# Patient Record
Sex: Male | Born: 1965 | Race: White | Hispanic: No | Marital: Married | Smoking: Former smoker
Health system: Southern US, Community
[De-identification: ages and names within clinical notes are randomized; demographics above are authoritative.]

## PROBLEM LIST (undated history)

## (undated) DIAGNOSIS — K602 Anal fissure, unspecified: Secondary | ICD-10-CM

## (undated) DIAGNOSIS — K219 Gastro-esophageal reflux disease without esophagitis: Secondary | ICD-10-CM

## (undated) DIAGNOSIS — K5790 Diverticulosis of intestine, part unspecified, without perforation or abscess without bleeding: Secondary | ICD-10-CM

## (undated) HISTORY — DX: Gastro-esophageal reflux disease without esophagitis: K21.9

## (undated) HISTORY — DX: Anal fissure, unspecified: K60.2

## (undated) HISTORY — DX: Diverticulosis of intestine, part unspecified, without perforation or abscess without bleeding: K57.90

---
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
## 2003-02-12 ENCOUNTER — Emergency Department (HOSPITAL_COMMUNITY): Admission: EM | Admit: 2003-02-12 | Discharge: 2003-02-12 | Payer: Self-pay | Admitting: Emergency Medicine

## 2003-02-12 ENCOUNTER — Encounter: Payer: Self-pay | Admitting: Emergency Medicine

## 2003-02-13 ENCOUNTER — Emergency Department (HOSPITAL_COMMUNITY): Admission: EM | Admit: 2003-02-13 | Discharge: 2003-02-13 | Payer: Self-pay | Admitting: *Deleted

## 2005-10-20 ENCOUNTER — Ambulatory Visit: Payer: Self-pay | Admitting: Internal Medicine

## 2005-10-29 ENCOUNTER — Ambulatory Visit: Payer: Self-pay | Admitting: Internal Medicine

## 2005-10-29 HISTORY — PX: PANENDOSCOPY: SHX2159

## 2011-04-10 ENCOUNTER — Encounter (INDEPENDENT_AMBULATORY_CARE_PROVIDER_SITE_OTHER): Payer: Self-pay | Admitting: General Surgery

## 2011-04-10 ENCOUNTER — Ambulatory Visit (INDEPENDENT_AMBULATORY_CARE_PROVIDER_SITE_OTHER): Payer: Private Health Insurance - Indemnity | Admitting: General Surgery

## 2011-04-10 VITALS — BP 122/76 | HR 68 | Temp 97.6°F | Resp 16 | Ht 72.0 in | Wt 191.0 lb

## 2011-04-10 DIAGNOSIS — K6289 Other specified diseases of anus and rectum: Secondary | ICD-10-CM

## 2011-04-10 DIAGNOSIS — K625 Hemorrhage of anus and rectum: Secondary | ICD-10-CM

## 2011-04-10 NOTE — Patient Instructions (Signed)
Call as needed for concerns or questions

## 2011-04-10 NOTE — Progress Notes (Signed)
Subjective:   Rectal pain and bleeding  Patient ID: Joseph Dominguez, male   DOB: August 04, 1966, 45 y.o.   MRN: [id number]  HPI The patient is a 45 year old male who presents with about 6 months of rectal symptoms. His has been gradually worsening. He had intermittent symptoms prior to this but now they have become continuous. He describes pain with bowel movements. It is described as burning pain and occasionally is fairly severe. It happens with each bowel movement. He then will have a little bit of leakage which requires going back to the bathroom to clean up. He will occasionally notice a small amount of blood with bowel movement. He has not noted any definite swelling or excess tissue. His bowel movements are generally soft and easy to pass. He has never had a colonoscopy. No definite history of colon cancer in his family.  Review of Systems  Constitutional: Negative.   Respiratory: Negative.   Cardiovascular: Negative.   Gastrointestinal: Positive for anal bleeding and rectal pain. Negative for vomiting, abdominal pain, diarrhea, constipation and abdominal distention.       Objective:   Physical Exam General: Well-appearing Caucasian male in no distress Skin: Warm and dry no rash or infection Lungs: Clear without increased work of breathing Abdomen: Soft and nontender. No palpable masses or organomegaly. Rectal: On external exam there appears to be a superficial fissure just off the midline posteriorly. No external hemorrhoids or thrombosis. On digital exam there is mild tenderness. There appears to be some hypertrophy of the internal sphincter. There is a very small posterior movable mass that feels to be on a stalk consistent with a possible small polyp.  Anoscopy: There are some mild noninflamed internal hemorrhoids. I was not able to visualize what I thought was a polyp I was feeling posteriorly    Assessment:     Anal pain and bleeding. This seems most consistent on exam with a fissure. He  may also have a small anal polyp. He has not had a colonoscopy and I recommended that we have this done. We'll start him on diltiazem cream for probable fissure. See him back in one month following the colonoscopy. We discussed sphincterotomy if the diltiazem was not effective. He may also need excision of an anal polyp.    Plan:     Diltiazem cream. Colonoscopy. Return in one month.

## 2011-05-18 ENCOUNTER — Encounter: Payer: Self-pay | Admitting: Internal Medicine

## 2011-05-18 ENCOUNTER — Ambulatory Visit (INDEPENDENT_AMBULATORY_CARE_PROVIDER_SITE_OTHER): Payer: Self-pay | Admitting: Internal Medicine

## 2011-05-18 VITALS — BP 124/72 | HR 64 | Ht 72.0 in | Wt 191.0 lb

## 2011-05-18 DIAGNOSIS — K602 Anal fissure, unspecified: Secondary | ICD-10-CM

## 2011-05-18 DIAGNOSIS — K625 Hemorrhage of anus and rectum: Secondary | ICD-10-CM

## 2011-05-18 DIAGNOSIS — R112 Nausea with vomiting, unspecified: Secondary | ICD-10-CM | POA: Insufficient documentation

## 2011-05-18 MED ORDER — PEG-KCL-NACL-NASULF-NA ASC-C 100 G PO SOLR: 1.0000 | Freq: Once | ORAL | Status: DC

## 2011-05-18 NOTE — Assessment & Plan Note (Signed)
History and response to diltiazem seem to confirm this He is 45 and has had rectal bleeding so colonoscopy reasonable. He has some concern due to sister's history of Crohn's disease. Will schedule colonoscopy. The risks and benefits as well as alternatives of endoscopic procedure(s) have been discussed and reviewed. All questions answered. The patient agrees to proceed.

## 2011-05-18 NOTE — Assessment & Plan Note (Signed)
I suggested he consider an ENT evaluation for possible inner ear problems as a cause. History does not really help much and chronicity supports a benign etiology.

## 2011-05-18 NOTE — Patient Instructions (Addendum)
You have been given a separate informational sheet regarding your tobacco use, the importance of quitting and local resources to help you quit. You have been scheduled for a Colonoscopy with separate instructions given. Your prep kit has been sent to your pharmacy for you to pick up. 

## 2011-05-18 NOTE — Progress Notes (Signed)
  Subjective:    Patient ID: Joseph Dominguez, male    DOB: [birth year], 45 y.o.   MRN: [id number]  HPI One year history of rectal pain and some rectal bleeding. Painful defecation and bright red blood on stool and toilet paper. Evaluated by Dr. Johna Sheriff and was diagnosed with an anal fissure and possible anal polyp 03/2011. Has been on diltiazem ream or gel and had a great response and minimal if any symptoms now. Dr. Johna Sheriff thought colonoscopy would be helpful to exclude other problems.   He also has 5 years of episodic and unpredictable nausea, sudden and sometimes asociated with vomiting. No associated symptoms or timing. Not related to meals or defecation. No vestibular or ear symptoms.   Review of Systems As above, no headacehs, visual changes and all other ROS negatve    Objective:   Physical Exam General: Well-developed, well-nourished and in no acute distress Vitals: Reviewed and listed above Eyes:anicteric. Mouth and posterior pharynx: normal.  Neck: supple w/o thyromegaly or mass.  Lungs: clear. Heart: S1S2, no rubs, murmurs, gallops. Abdomen: soft, non-tender, no hepatosplenomegaly, hernia, or mass and BS+.  Rectal: inspected - small posterior sentinel pile otherwise anal inspection normal Lymphatics: no cervical, [place] or inguinal nodes. Extremities:  no edema Skin no rash. Neuro: nonfocal. A&O x 3 Romberg negative Psych: appropriate mood and  affect.        Assessment & Plan:

## 2011-05-21 ENCOUNTER — Encounter: Payer: Self-pay | Admitting: Internal Medicine

## 2011-05-22 ENCOUNTER — Encounter (INDEPENDENT_AMBULATORY_CARE_PROVIDER_SITE_OTHER): Payer: Private Health Insurance - Indemnity | Admitting: General Surgery

## 2011-05-29 ENCOUNTER — Encounter (INDEPENDENT_AMBULATORY_CARE_PROVIDER_SITE_OTHER): Payer: Self-pay | Admitting: General Surgery

## 2011-06-24 ENCOUNTER — Ambulatory Visit (AMBULATORY_SURGERY_CENTER): Payer: Self-pay | Admitting: Internal Medicine

## 2011-06-24 ENCOUNTER — Encounter: Payer: Self-pay | Admitting: Internal Medicine

## 2011-06-24 VITALS — BP 114/66 | HR 54 | Temp 97.3°F | Resp 16 | Ht 72.0 in | Wt 191.0 lb

## 2011-06-24 DIAGNOSIS — K648 Other hemorrhoids: Secondary | ICD-10-CM

## 2011-06-24 DIAGNOSIS — R112 Nausea with vomiting, unspecified: Secondary | ICD-10-CM

## 2011-06-24 DIAGNOSIS — K625 Hemorrhage of anus and rectum: Secondary | ICD-10-CM

## 2011-06-24 DIAGNOSIS — K602 Anal fissure, unspecified: Secondary | ICD-10-CM

## 2011-06-24 DIAGNOSIS — D126 Benign neoplasm of colon, unspecified: Secondary | ICD-10-CM

## 2011-06-24 HISTORY — PX: COLONOSCOPY: SHX174

## 2011-06-24 MED ORDER — SODIUM CHLORIDE 0.9 % IV SOLN: 500.0000 mL | INTRAVENOUS | Status: DC

## 2011-06-24 NOTE — Progress Notes (Signed)
No complaints in the recovery room or on discharge.  maw

## 2011-06-24 NOTE — Patient Instructions (Addendum)
There were 2 small polyps removed. They appear benign and I will send you a letter about the results. Some small hemorrhoids. I did not see any active fissure at this time. I will let you know when your next routine preventive colonoscopy should be depending upon the polyp pathology results. Iva Boop, MD, [facility]   Please call if any questions or concerns. Please follow the blue and green discharge instruction sheets the rest of the day.

## 2011-06-25 ENCOUNTER — Telehealth: Payer: Self-pay | Admitting: *Deleted

## 2011-06-25 NOTE — Telephone Encounter (Signed)
No answer, message left

## 2011-07-05 ENCOUNTER — Encounter: Payer: Self-pay | Admitting: Internal Medicine

## 2011-07-05 NOTE — Progress Notes (Signed)
Quick Note:  No significant polyps, both were diminutive and hyperplastic. Plan routine repeat colonoscopy 10 years, 2022 ______

## 2011-07-06 ENCOUNTER — Encounter: Payer: Self-pay | Admitting: *Deleted

## 2011-07-23 NOTE — Progress Notes (Signed)
Please notify the patient that we have his pathology information.  It is ok to leave him a message that we need to speak to him - we may be able to get a correct address that way, too.

## 2011-07-23 NOTE — Progress Notes (Signed)
Pathology results letter returned via [employer]- "Return to sender-no such street-unable to forward"  Verified address on envelope with address on file.  Attempted to call patient- No ID on answering machine so no message was left.

## 2011-07-24 NOTE — Progress Notes (Signed)
Spoke with patient regarding pathology results letter.  Pt states he does not receive mail at his residential address, rather a [address]. He would like a copy of his path results mailed to: [id number], [place], [place] [address].

## 2016-07-16 ENCOUNTER — Telehealth (HOSPITAL_COMMUNITY): Payer: Self-pay | Admitting: Internal Medicine

## 2016-07-17 NOTE — Telephone Encounter (Signed)
Close encounter 

## 2016-07-21 ENCOUNTER — Other Ambulatory Visit: Payer: Self-pay | Admitting: Internal Medicine

## 2016-07-21 DIAGNOSIS — R079 Chest pain, unspecified: Secondary | ICD-10-CM

## 2016-07-24 ENCOUNTER — Encounter: Payer: Self-pay | Admitting: Internal Medicine

## 2016-07-31 ENCOUNTER — Ambulatory Visit (INDEPENDENT_AMBULATORY_CARE_PROVIDER_SITE_OTHER): Payer: Managed Care, Other (non HMO)

## 2016-07-31 DIAGNOSIS — R079 Chest pain, unspecified: Secondary | ICD-10-CM | POA: Diagnosis not present

## 2016-07-31 LAB — EXERCISE TOLERANCE TEST
CHL CUP RESTING HR STRESS: 63 {beats}/min
CHL CUP STRESS STAGE 1 GRADE: 0 %
CHL CUP STRESS STAGE 1 HR: 75 {beats}/min
CHL CUP STRESS STAGE 1 SBP: 130 mmHg
CHL CUP STRESS STAGE 1 SPEED: 0 mph
CHL CUP STRESS STAGE 2 GRADE: 0 %
CHL CUP STRESS STAGE 2 HR: 63 {beats}/min
CHL CUP STRESS STAGE 2 SPEED: 1 mph
CHL CUP STRESS STAGE 4 GRADE: 10 %
CHL CUP STRESS STAGE 5 GRADE: 12 %
CHL CUP STRESS STAGE 5 SBP: 156 mmHg
CHL CUP STRESS STAGE 5 SPEED: 2.5 mph
CHL CUP STRESS STAGE 6 GRADE: 14 %
CHL CUP STRESS STAGE 6 HR: 146 {beats}/min
CHL CUP STRESS STAGE 7 GRADE: 16 %
CHL CUP STRESS STAGE 7 HR: 153 {beats}/min
CHL CUP STRESS STAGE 7 SPEED: 4.2 mph
CHL CUP STRESS STAGE 8 DBP: 69 mmHg
CHL CUP STRESS STAGE 9 DBP: 78 mmHg
CHL CUP STRESS STAGE 9 SPEED: 0 mph
CHL RATE OF PERCEIVED EXERTION: 17
CSEPED: 10 min
CSEPEDS: 0 s
CSEPEW: 11.7 METS
CSEPHR: 93 %
MPHR: 168 {beats}/min
Peak HR: 153 {beats}/min
Percent of predicted max HR: 91 %
Stage 1 DBP: 83 mmHg
Stage 3 Grade: 0 %
Stage 3 HR: 63 {beats}/min
Stage 3 Speed: 1 mph
Stage 4 DBP: 78 mmHg
Stage 4 HR: 98 {beats}/min
Stage 4 SBP: 141 mmHg
Stage 4 Speed: 1.7 mph
Stage 5 DBP: 78 mmHg
Stage 5 HR: 121 {beats}/min
Stage 6 DBP: 82 mmHg
Stage 6 SBP: 162 mmHg
Stage 6 Speed: 3.4 mph
Stage 8 Grade: 0 %
Stage 8 HR: 131 {beats}/min
Stage 8 SBP: 173 mmHg
Stage 8 Speed: 1.5 mph
Stage 9 Grade: 0 %
Stage 9 HR: 99 {beats}/min
Stage 9 SBP: 146 mmHg

## 2019-06-29 ENCOUNTER — Ambulatory Visit (INDEPENDENT_AMBULATORY_CARE_PROVIDER_SITE_OTHER): Payer: Commercial Managed Care - PPO | Admitting: Physician Assistant

## 2019-06-29 ENCOUNTER — Other Ambulatory Visit: Payer: Self-pay

## 2019-06-29 ENCOUNTER — Encounter: Payer: Self-pay | Admitting: Physician Assistant

## 2019-06-29 VITALS — BP 102/62 | HR 58 | Temp 97.6°F | Ht 73.0 in | Wt 224.0 lb

## 2019-06-29 DIAGNOSIS — Z1159 Encounter for screening for other viral diseases: Secondary | ICD-10-CM

## 2019-06-29 DIAGNOSIS — K625 Hemorrhage of anus and rectum: Secondary | ICD-10-CM | POA: Diagnosis not present

## 2019-06-29 NOTE — Progress Notes (Signed)
Chief Complaint: Rectal bleeding  HPI:    Joseph Dominguez is a 53 year old Caucasian male with a past medical history as listed below, known to Dr. Carlean Purl, who was referred to me by Ileana Roup, MD for a complaint of rectal bleeding.    05/24/2011 colonoscopy Dr. Carlean Purl with removal of 2 diminutive polyps, moderate diverticulosis in the sigmoid colon and internal hemorrhoids.  Pathology showed benign polyps and repeat was recommended in 10 years.    05/30/2019 patient seen by [facility], Dr. Dema Severin, for bright red blood per rectum after bowel movement/with bowel movements as well as occasionally some leakage of stool after bowel movement/moisture.  At that time discussed his symptoms which were similar back in 2012 which improved on their own.  Over the past 6 or 7 months patient had noticed repeat symptoms and was referred to [facility] for question of fissure.  Patient evaluated with an anoscopy which showed small hypertrophic papillae on both the right and left side and small internal hemorrhoids.  There was no evidence of any fistula or fissure.  At that time he was referred back to [place] given his rectal exam without any major findings and reports of bleeding.    Today, the patient tells me that he has bright red blood on the toilet paper as well as in his stool off and on, this typically last for 12 hours when it starts and then will stop on its own.  Often related to a larger stool than normal, sometimes with rectal pain.  Typically he places Preparation H on the area and it gets better.  Tells me that he had a long talk with Dr. Dema Severin about using fiber and not spending so much time on the toilet and drinking water which he thinks is helping over the past month or so.  Explained that there was some worry regarding his rectal bleeding and no obvious sign for it during time of Dr. Orest Dikes exam.  He recommended having a colonoscopy.    Denies fever, chills, weight loss, abdominal pain, change in bowel habits or  symptoms that awaken him from sleep.     Past Medical History:  Diagnosis Date  . Anal fissure   . Diverticulosis   . GERD (gastroesophageal reflux disease)   . Hemorrhoids 2012    Past Surgical History:  Procedure Laterality Date  . COLONOSCOPY  06/24/11   Diverticulosis, internal hemorrhoids,  no significant  polyps  . MANDIBLE SURGERY  1979   Lower jaw alignment  . PANENDOSCOPY  10/29/2005   hiatal hernia    Current Outpatient Medications  Medication Sig Dispense Refill  . psyllium (METAMUCIL) 58.6 % powder Take 1 packet by mouth daily.     No current facility-administered medications for this visit.     Allergies as of 06/29/2019 - Review Complete 06/29/2019  Allergen Reaction Noted  . Sulfa antibiotics Rash 04/10/2011    Family History  Problem Relation Age of Onset  . Diabetes Mother   . Diabetes Father   . Colon cancer Neg Hx   . Stomach cancer Neg Hx   . Pancreatic cancer Neg Hx   . Esophageal cancer Neg Hx     Social History   Socioeconomic History  . Marital status: Married    Spouse name: Not on file  . Number of children: Not on file  . Years of education: Not on file  . Highest education level: Not on file  Occupational History  . Not on file  Social  Needs  . Financial resource strain: Not on file  . Food insecurity    Worry: Not on file    Inability: Not on file  . Transportation needs    Medical: Not on file    Non-medical: Not on file  Tobacco Use  . Smoking status: Current Every Day Smoker    Packs/day: 0.25    Types: Cigarettes  . Smokeless tobacco: Never Used  . Tobacco comment: Patient given counseling sheet on smoking in exam room  Substance and Sexual Activity  . Alcohol use: Yes    Alcohol/week: 3.0 standard drinks    Types: 3 drink(s) per week  . Drug use: No  . Sexual activity: Not on file  Lifestyle  . Physical activity    Days per week: Not on file    Minutes per session: Not on file  . Stress: Not on file   Relationships  . Social Herbalist on phone: Not on file    Gets together: Not on file    Attends religious service: Not on file    Active member of club or organization: Not on file    Attends meetings of clubs or organizations: Not on file    Relationship status: Not on file  . Intimate partner violence    Fear of current or ex partner: Not on file    Emotionally abused: Not on file    Physically abused: Not on file    Forced sexual activity: Not on file  Other Topics Concern  . Not on file  Social History Narrative  . Not on file    Review of Systems:    Constitutional: No weight loss, fever or chills Skin: No rash  Cardiovascular: No chest pain Respiratory: No SOB Gastrointestinal: See HPI and otherwise negative Genitourinary: No dysuria Neurological: No headache, dizziness or syncope Musculoskeletal: No new muscle or joint pain Hematologic: No bruising Psychiatric: No history of depression or anxiety   Physical Exam:  Vital signs: BP 102/62   Pulse (!) 58   Temp 97.6 F (36.4 C)   Ht 6\' 1"  (1.854 m)   Wt 224 lb (101.6 kg)   BMI 29.55 kg/m   Constitutional:   Pleasant Caucasian male appears to be in NAD, Well developed, Well nourished, alert and cooperative Head:  Normocephalic and atraumatic. Eyes:   PEERL, EOMI. No icterus. Conjunctiva pink. Ears:  Normal auditory acuity. Neck:  Supple Throat: Oral cavity and pharynx without inflammation, swelling or lesion.  Respiratory: Respirations even and unlabored. Lungs clear to auscultation bilaterally.   No wheezes, crackles, or rhonchi.  Cardiovascular: Normal S1, S2. No MRG. Regular rate and rhythm. No peripheral edema, cyanosis or pallor.  Gastrointestinal:  Soft, nondistended, nontender. No rebound or guarding. Normal bowel sounds. No appreciable masses or hepatomegaly. Rectal:  Not performed.  Msk:  Symmetrical without gross deformities. Without edema, no deformity or joint abnormality.  Neurologic:   Alert and  oriented x4;  grossly normal neurologically.  Skin:   Dry and intact without significant lesions or rashes. Psychiatric: Demonstrates good judgement and reason without abnormal affect or behaviors.  No recent labs or imaging.  Assessment: 1.  Rectal bleeding: Intermittently over the past 8 years or so, worse over the past 6 to 7 months, recent anoscopy by Dr. Dema Severin with [facility] with no real findings other than some small internal hemorrhoids; consider hemorrhoids versus fissure versus other  Plan: 1.  Scheduled the patient for a diagnostic colonoscopy given his rectal  bleeding.  This was scheduled with Dr. Carlean Purl in the [facility].  Did discuss risks, benefits, limitations and alternatives and the patient agrees to proceed.  He will be Covid tested prior to time of exam. 2.  Recommend the patient continue his increased fiber and water intake as well as decreasing time on the toilet, everything he discussed with Dr. Dema Severin is very aimportant 3.  Patient to follow in clinic per recommendations from Dr. Carlean Purl after time of procedure.  Joseph Newer, PA-C [facility] 06/29/2019, 8:43 AM  Cc: Ileana Roup, MD

## 2019-06-29 NOTE — Patient Instructions (Signed)
If you are age 55 or older, your body mass index should be between 23-30. Your Body mass index is 29.55 kg/m. If this is out of the aforementioned range listed, please consider follow up with your Primary Care Provider.  If you are age 87 or younger, your body mass index should be between 19-25. Your Body mass index is 29.55 kg/m. If this is out of the aformentioned range listed, please consider follow up with your Primary Care Provider.   You have been scheduled for a colonoscopy. Please follow written instructions given to you at your visit today.  Please pick up your prep supplies at the pharmacy within the next 1-3 days. If you use inhalers (even only as needed), please bring them with you on the day of your procedure.

## 2019-07-07 ENCOUNTER — Encounter: Payer: Self-pay | Admitting: Internal Medicine

## 2019-07-12 ENCOUNTER — Other Ambulatory Visit: Payer: Self-pay | Admitting: Internal Medicine

## 2019-07-14 LAB — SARS CORONAVIRUS 2 (TAT 6-24 HRS): SARS Coronavirus 2: NEGATIVE

## 2019-07-17 ENCOUNTER — Encounter: Payer: Self-pay | Admitting: Internal Medicine

## 2019-07-17 ENCOUNTER — Ambulatory Visit (AMBULATORY_SURGERY_CENTER): Payer: Commercial Managed Care - PPO | Admitting: Internal Medicine

## 2019-07-17 ENCOUNTER — Other Ambulatory Visit: Payer: Self-pay

## 2019-07-17 VITALS — BP 104/68 | HR 54 | Temp 97.6°F | Resp 13 | Ht 73.0 in | Wt 224.0 lb

## 2019-07-17 DIAGNOSIS — K6289 Other specified diseases of anus and rectum: Secondary | ICD-10-CM

## 2019-07-17 DIAGNOSIS — D123 Benign neoplasm of transverse colon: Secondary | ICD-10-CM

## 2019-07-17 DIAGNOSIS — K625 Hemorrhage of anus and rectum: Secondary | ICD-10-CM

## 2019-07-17 MED ORDER — SODIUM CHLORIDE 0.9 % IV SOLN: 500.0000 mL | Freq: Once | INTRAVENOUS | Status: AC

## 2019-07-17 NOTE — Progress Notes (Signed)
A/ox3, pleased with MAC, report to RN 

## 2019-07-17 NOTE — Progress Notes (Signed)
Called to room to assist during endoscopic procedure.  Patient ID and intended procedure confirmed with present staff. Received instructions for my participation in the procedure from the performing physician.  

## 2019-07-17 NOTE — Progress Notes (Signed)
Joseph Dominguez-checked patient in.

## 2019-07-17 NOTE — Patient Instructions (Addendum)
I found and removed one tiny polyp.  Saw the hemorrhoids.  I will let you know pathology results and when to have another routine colonoscopy by mail and/or My Chart.  FYI if you get offered a stool test at annual physical labs please decline that - since you have had a colonoscopy do not need that plus the hemorrhoids might make it turn positive for blood.  I appreciate the opportunity to care for you. Gatha Mayer, MD, FACG YOU HAD AN ENDOSCOPIC PROCEDURE TODAY AT [facility]:   Refer to the procedure report that was given to you for any specific questions about what was found during the examination.  If the procedure report does not answer your questions, please call your gastroenterologist to clarify.  If you requested that your care partner not be given the details of your procedure findings, then the procedure report has been included in a sealed envelope for you to review at your convenience later.  YOU SHOULD EXPECT: Some feelings of bloating in the abdomen. Passage of more gas than usual.  Walking can help get rid of the air that was put into your GI tract during the procedure and reduce the bloating. If you had a lower endoscopy (such as a colonoscopy or flexible sigmoidoscopy) you may notice spotting of blood in your stool or on the toilet paper. If you underwent a bowel prep for your procedure, you may not have a normal bowel movement for a few days.  Please Note:  You might notice some irritation and congestion in your nose or some drainage.  This is from the oxygen used during your procedure.  There is no need for concern and it should clear up in a day or so.  SYMPTOMS TO REPORT IMMEDIATELY:   Following lower endoscopy (colonoscopy or flexible sigmoidoscopy):  Excessive amounts of blood in the stool  Significant tenderness or worsening of abdominal pains  Swelling of the abdomen that is new, acute  Fever of 100F or higher  For urgent or emergent issues, a  gastroenterologist can be reached at any hour by calling [phone].   DIET:  We do recommend a small meal at first, but then you may proceed to your regular diet.  Drink plenty of fluids but you should avoid alcoholic beverages for 24 hours.  MEDICATIONS: Continue present medications.  Please see handouts given to you by your recovery nurse.  ACTIVITY:  You should plan to take it easy for the rest of today and you should NOT DRIVE or use heavy machinery until tomorrow (because of the sedation medicines used during the test).    FOLLOW UP: Our staff will call the number listed on your records 48-72 hours following your procedure to check on you and address any questions or concerns that you may have regarding the information given to you following your procedure. If we do not reach you, we will leave a message.  We will attempt to reach you two times.  During this call, we will ask if you have developed any symptoms of COVID 19. If you develop any symptoms (ie: fever, flu-like symptoms, shortness of breath, cough etc.) before then, please call [phone].  If you test positive for Covid 19 in the 2 weeks post procedure, please call and report this information to [place].    If any biopsies were taken you will be contacted by phone or by letter within the next 1-3 weeks.  Please call us at [phone] if  you have not heard about the biopsies in 3 weeks.   Thank you for allowing [place] to provide for your healthcare needs today.   SIGNATURES/CONFIDENTIALITY: You and/or your care partner have signed paperwork which will be entered into your electronic medical record.  These signatures attest to the fact that that the information above on your After Visit Summary has been reviewed and is understood.  Full responsibility of the confidentiality of this discharge information lies with you and/or your care-partner.

## 2019-07-17 NOTE — Progress Notes (Signed)
Pt's states no medical or surgical changes since previsit or office visit. 

## 2019-07-17 NOTE — Op Note (Signed)
[facility] Patient Name: Joseph Dominguez Procedure Date: 07/17/2019 8:57 AM MRN: [id number] Endoscopist: Gatha Mayer , MD Age: 53 Referring MD:  Date of Birth: 1966/06/05 Gender: Male Account #: [id number] Procedure:                Colonoscopy Indications:              Rectal bleeding Medicines:                Propofol per Anesthesia, Monitored Anesthesia Care Procedure:                Pre-Anesthesia Assessment:                           - Prior to the procedure, a History and Physical                            was performed, and patient medications and                            allergies were reviewed. The patient's tolerance of                            previous anesthesia was also reviewed. The risks                            and benefits of the procedure and the sedation                            options and risks were discussed with the patient.                            All questions were answered, and informed consent                            was obtained. Prior Anticoagulants: The patient has                            taken no previous anticoagulant or antiplatelet                            agents. ASA Grade Assessment: II - A patient with                            mild systemic disease. After reviewing the risks                            and benefits, the patient was deemed in                            satisfactory condition to undergo the procedure.                           After obtaining informed consent, the colonoscope  was passed under direct vision. Throughout the                            procedure, the patient's blood pressure, pulse, and                            oxygen saturations were monitored continuously. The                            Colonoscope was introduced through the anus and                            advanced to the the cecum, identified by                            appendiceal orifice and ileocecal  valve. The                            colonoscopy was performed without difficulty. The                            patient tolerated the procedure well. The quality                            of the bowel preparation was excellent. The                            ileocecal valve, appendiceal orifice, and rectum                            were photographed. The bowel preparation used was                            Miralax via split dose instruction. Scope In: 8:59:50 AM Scope Out: 9:09:58 AM Scope Withdrawal Time: 0 hours 9 minutes 3 seconds  Total Procedure Duration: 0 hours 10 minutes 8 seconds  Findings:                 The perianal and digital rectal examinations were                            normal. Pertinent negatives include normal prostate                            (size, shape, and consistency).                           A diminutive polyp was found in the distal                            transverse colon. The polyp was sessile. The polyp                            was removed with a cold snare. Resection and  retrieval were complete. Verification of patient                            identification for the specimen was done. Estimated                            blood loss was minimal.                           External and internal hemorrhoids were found.                           Anal papilla(e) were hypertrophied.                           The exam was otherwise without abnormality on                            direct and retroflexion views. Complications:            No immediate complications. Estimated Blood Loss:     Estimated blood loss was minimal. Impression:               - One diminutive polyp in the distal transverse                            colon, removed with a cold snare. Resected and                            retrieved.                           - External and internal hemorrhoids.                           - Anal papilla(e) were  hypertrophied.                           - The examination was otherwise normal on direct                            and retroflexion views. Recommendation:           - Patient has a contact number available for                            emergencies. The signs and symptoms of potential                            delayed complications were discussed with the                            patient. Return to normal activities tomorrow.                            Written discharge instructions were provided to the  patient.                           - Resume previous diet.                           - Continue present medications.                           - Repeat colonoscopy is recommended. The                            colonoscopy date will be determined after pathology                            results from today's exam become available for                            review. Gatha Mayer, MD 07/17/2019 9:17:24 AM This report has been signed electronically.

## 2019-07-19 ENCOUNTER — Telehealth: Payer: Self-pay

## 2019-07-19 ENCOUNTER — Telehealth: Payer: Self-pay | Admitting: *Deleted

## 2019-07-19 NOTE — Telephone Encounter (Signed)
Follow call made, unable to leave message as voice mailbox is not set up.

## 2019-07-19 NOTE — Telephone Encounter (Signed)
  Follow up Call-  Call back number 07/17/2019  Post procedure Call Back phone  # [phone]  Permission to leave phone message Yes  Some recent data might be hidden     Patient questions:  Do you have a fever, pain , or abdominal swelling? No. Pain Score  0 *  Have you tolerated food without any problems? Yes.    Have you been able to return to your normal activities? Yes.    Do you have any questions about your discharge instructions: Diet   No. Medications  No. Follow up visit  No.  Do you have questions or concerns about your Care? No.  Actions: * If pain score is 4 or above: No action needed, pain <4.  1. Have you developed a fever since your procedure? no  2.   Have you had an respiratory symptoms (SOB or cough) since your procedure? no  3.   Have you tested positive for COVID 19 since your procedure no  4.   Have you had any family members/close contacts diagnosed with the COVID 19 since your procedure?  no   If yes to any of these questions please route to [name], RN and Alphonsa Gin, Therapist, sports.

## 2019-07-23 ENCOUNTER — Encounter: Payer: Self-pay | Admitting: Internal Medicine

## 2019-07-23 DIAGNOSIS — Z860100 Personal history of colon polyps, unspecified: Secondary | ICD-10-CM

## 2019-07-23 HISTORY — DX: Personal history of colonic polyps: Z86.010

## 2019-07-23 HISTORY — DX: Personal history of colon polyps, unspecified: Z86.0100

## 2019-07-23 NOTE — Progress Notes (Signed)
diminutiive ssp Recall 2027

## 2019-11-13 ENCOUNTER — Other Ambulatory Visit: Payer: Self-pay

## 2019-11-13 ENCOUNTER — Ambulatory Visit: Payer: Commercial Managed Care - PPO | Attending: Internal Medicine

## 2019-11-13 DIAGNOSIS — Z23 Encounter for immunization: Secondary | ICD-10-CM

## 2019-11-13 NOTE — Progress Notes (Signed)
   Covid-19 Vaccination Clinic  Name:  Hondo Vandenheuvel    MRN: [id number] DOB: Feb 25, 1966  11/13/2019  Mr. Rochette was observed post Covid-19 immunization for 15 minutes without incident. He was provided with Vaccine Information Sheet and instruction to access the V-Safe system.   Mr. Mikrut was instructed to call 911 with any severe reactions post vaccine: Marland Kitchen Difficulty breathing  . Swelling of face and throat  . A fast heartbeat  . A bad rash all over body  . Dizziness and weakness   Immunizations Administered    Name Date Dose VIS Date Route   Pfizer COVID-19 Vaccine 11/13/2019 12:53 PM 0.3 mL 07/28/2019 Intramuscular   Manufacturer: [place]   Lot: [id number]   [place]: [id number]

## 2019-12-06 ENCOUNTER — Ambulatory Visit: Payer: Commercial Managed Care - PPO | Attending: Internal Medicine

## 2019-12-06 DIAGNOSIS — Z23 Encounter for immunization: Secondary | ICD-10-CM

## 2019-12-06 NOTE — Progress Notes (Signed)
   Covid-19 Vaccination Clinic  Name:  Joseph Dominguez    MRN: [id number] DOB: 10-Dec-1965  12/06/2019  Mr. Joseph Dominguez was observed post Covid-19 immunization for 15 minutes without incident. He was provided with Vaccine Information Sheet and instruction to access the V-Safe system.   Mr. Joseph Dominguez was instructed to call 911 with any severe reactions post vaccine: Marland Kitchen Difficulty breathing  . Swelling of face and throat  . A fast heartbeat  . A bad rash all over body  . Dizziness and weakness   Immunizations Administered    Name Date Dose VIS Date Route   Pfizer COVID-19 Vaccine 12/06/2019  1:39 PM 0.3 mL 10/11/2018 Intramuscular   Manufacturer: [employer]   Lot: [id number]   [place]: [id number]

## 2020-01-24 ENCOUNTER — Telehealth: Payer: Self-pay

## 2020-01-24 NOTE — Telephone Encounter (Signed)
NOTES ON FILE FROM  DR Domenick Gong [phone], SENT REFERRAL TO SCHEDULING

## 2020-01-24 NOTE — Telephone Encounter (Signed)
NOTES ON FILE FROM  [facility] [phone] SENT REFERRAL TO SCHEDULING. 

## 2020-02-13 DIAGNOSIS — R42 Dizziness and giddiness: Secondary | ICD-10-CM | POA: Insufficient documentation

## 2020-02-13 DIAGNOSIS — R002 Palpitations: Secondary | ICD-10-CM | POA: Insufficient documentation

## 2020-02-13 NOTE — Progress Notes (Signed)
Cardiology Office Note   Date:  02/14/2020   ID:  Joseph Dominguez, DOB [birth year], MRN [id number]  PCP:  Haywood Pao, MD  Cardiologist:   No primary care provider on file. Referring:  Tisovec, Fransico Him, MD  Chief Complaint  Patient presents with  . Dizziness      History of Present Illness: Joseph Dominguez is a 54 y.o. male who was referred by Tisovec, Fransico Him, MD for evaluation of palpitations and near syncope. The patient has no past cardiac history other than having a stress test in 2017. This was negative for any evidence of ischemia.  He had an episode recently. It was after lunch. He was sitting in his desk. He felt bloated. He felt weak and lightheaded and clammy. He was diaphoretic. He went to a different office where it was cooler and eventually EMS was called. However, he did need to be transported as things improved. He had some tunnel vision and near syncope with the whole episode lasted about 30 minutes. When it resolved he went home. He has never had this before and he hasn't had it since. He doesn't pass out typically. He might feel his heart racing but is only after he has had too much to drink which he might do rarely on the weekends. He has not had any chest pressure, neck or arm discomfort. He doesn't have any shortness of breath, PND or orthopnea. Said no weight gain or edema. He does have some cardiovascular risk factors with dyslipidemia and a family history.   Past Medical History:  Diagnosis Date  . Anal fissure   . Diverticulosis   . GERD (gastroesophageal reflux disease)   . Hemorrhoids 2012  . Hx of sessile serrated colonic polyp 07/23/2019    Past Surgical History:  Procedure Laterality Date  . COLONOSCOPY  06/24/11   Diverticulosis, internal hemorrhoids,  no significant  polyps  . MANDIBLE SURGERY  1979   Lower jaw alignment  . PANENDOSCOPY  10/29/2005   hiatal hernia     Current Outpatient Medications  Medication Sig Dispense Refill  . psyllium  (METAMUCIL) 58.6 % powder Take 1 packet by mouth daily.     Current Facility-Administered Medications  Medication Dose Route Frequency Provider Last Rate Last Admin  . 0.9 %  sodium chloride infusion  500 mL Intravenous Once Gatha Mayer, MD        Allergies:   Sulfa antibiotics    Social History:  The patient  reports that he has quit smoking. His smoking use included cigarettes. He has a 6.25 pack-year smoking history. He has never used smokeless tobacco. He reports current alcohol use of about 3.0 standard drinks of alcohol per week. He reports that he does not use drugs.   Family History:  The patient's family history includes Diabetes in his father and mother.    ROS:  Please see the history of present illness.   Otherwise, review of systems are positive for none.   All other systems are reviewed and negative.    PHYSICAL EXAM: VS:  BP 115/60   Pulse (!) 49   Temp (!) 97.1 F (36.2 C)   Ht 6\' 1"  (1.854 m)   Wt 218 lb 3.2 oz (99 kg)   SpO2 95%   BMI 28.79 kg/m  , BMI Body mass index is 28.79 kg/m. GENERAL:  Well appearing HEENT:  Pupils equal round and reactive, fundi not visualized, oral mucosa unremarkable NECK:  No jugular venous distention,  waveform within normal limits, carotid upstroke brisk and symmetric, no bruits, no thyromegaly LYMPHATICS:  No cervical, inguinal adenopathy LUNGS:  Clear to auscultation bilaterally BACK:  No CVA tenderness CHEST:  Unremarkable HEART:  PMI not displaced or sustained,S1 and S2 within normal limits, no S3, no S4, no clicks, no rubs, no murmurs ABD:  Flat, positive bowel sounds normal in frequency in pitch, no bruits, no rebound, no guarding, no midline pulsatile mass, no hepatomegaly, no splenomegaly EXT:  2 plus pulses throughout, no edema, no cyanosis no clubbing SKIN:  No rashes no nodules NEURO:  Cranial nerves II through XII grossly intact, motor grossly intact throughout PSYCH:  Cognitively intact, oriented to person place  and time    EKG:  EKG is ordered today. The ekg ordered today demonstrates sinus bradycardia, rate 49, axis within normal limits, intervals within normal limits, no acute ST-T wave changes.   Recent Labs: No results found for requested labs within last 8760 hours.    Lipid Panel No results found for: CHOL, TRIG, HDL, CHOLHDL, VLDL, LDLCALC, LDLDIRECT    Wt Readings from Last 3 Encounters:  02/14/20 218 lb 3.2 oz (99 kg)  07/17/19 224 lb (101.6 kg)  06/29/19 224 lb (101.6 kg)      Other studies Reviewed: Additional studies/ records that were reviewed today include: Labs. Review of the above records demonstrates:  Please see elsewhere in the note.     ASSESSMENT AND PLAN:   NEAR SYNCOPE:   This was most likely a vagal episode. He does have bradycardia as addressed below. However, we talked about this and in the absence of further episodes I would not suggest further testing. He does not have significant orthostatic symptoms. He'll let me know if this recurs. Of note he has some tachypalpitations but he always associates this with alcohol we talked about that. He might have some disordered sleep as he does snore and this could be exacerbated by alcohol.  BRADYCARDIA: He has had no symptoms related to this. There've been no reason to suspect chronotropic incompetence. We talked about this and he can keep an eye on his heart rate when he is doing activities to make sure he is in normal response. He has no other conduction abnormalities.  DYSLIPIDEMIA:   His LDL late last year was 150 with an HDL of 41. He does have some family history. Given this I think it is prudent to screen him with a coronary calcium score. We talked about how we could use this for risk prediction as well as setting goals of therapy for his lipids. He would be in agreement with this and we will schedule it..    Current medicines are reviewed at length with the patient today.  The patient does not have concerns  regarding medicines.  The following changes have been made:  no change  Labs/ tests ordered today include: None  Orders Placed This Encounter  Procedures  . CT CARDIAC SCORING  . EKG 12-Lead     Disposition:   FU with me as needed.      Signed, Minus Breeding, MD  02/14/2020 9:08 AM    [facility]

## 2020-02-14 ENCOUNTER — Encounter: Payer: Self-pay | Admitting: Emergency Medicine

## 2020-02-14 ENCOUNTER — Ambulatory Visit
Admission: EM | Admit: 2020-02-14 | Discharge: 2020-02-14 | Disposition: A | Discharge status: Home / Self Care | Payer: Commercial Managed Care - PPO

## 2020-02-14 ENCOUNTER — Other Ambulatory Visit: Payer: Self-pay

## 2020-02-14 ENCOUNTER — Ambulatory Visit (INDEPENDENT_AMBULATORY_CARE_PROVIDER_SITE_OTHER): Payer: Commercial Managed Care - PPO | Admitting: Cardiology

## 2020-02-14 ENCOUNTER — Encounter: Payer: Self-pay | Admitting: Cardiology

## 2020-02-14 VITALS — BP 115/60 | HR 49 | Temp 97.1°F | Ht 73.0 in | Wt 218.2 lb

## 2020-02-14 DIAGNOSIS — R42 Dizziness and giddiness: Secondary | ICD-10-CM

## 2020-02-14 DIAGNOSIS — H5711 Ocular pain, right eye: Secondary | ICD-10-CM | POA: Diagnosis not present

## 2020-02-14 DIAGNOSIS — R002 Palpitations: Secondary | ICD-10-CM

## 2020-02-14 NOTE — Discharge Instructions (Signed)
No signs of foreign body, corneal abrasion.  Get over-the-counter Systane/GenTeal artificial tears gel.  Can put in fridge, use once when you get it, and at least 1 more time before bedtime.  If symptoms not improving tomorrow, having vision changes, sensitivity to light, significant pain, follow-up with ophthalmology for further evaluation.

## 2020-02-14 NOTE — ED Triage Notes (Signed)
Pt presents to [facility] for assessment after he was cutting tree limbs above his head with no eye protection, and states a bunch of leaves and twigs fell down striking him in the right eye.  Patient denies changes in vision at this time, but is unable to look right without excruciating pain.

## 2020-02-14 NOTE — ED Provider Notes (Signed)
[facility]    CSN: [id number] Arrival date & time: 02/14/20  1900      History   Chief Complaint Chief Complaint  Patient presents with   Eye Pain    HPI Joseph Dominguez is a 54 y.o. male.   54 year old male comes in for right eye pain that occurred today.  States he was cutting tree limbs without eye protection, and leaves in 2 weeks fell down, striking his right eye.  Since then, with eye pain, especially when looking right.  He irrigated the eye with tap water, and came in for evaluation.  Denies vision changes, foreign body sensation, photophobia.  Some eye watering.  Denies contact lens use.  Uses reading glasses.     Past Medical History:  Diagnosis Date   Anal fissure    Diverticulosis    GERD (gastroesophageal reflux disease)    Hemorrhoids 2012   Hx of sessile serrated colonic polyp 07/23/2019    Patient Active Problem List   Diagnosis Date Noted   Dizziness 02/13/2020   Palpitations 02/13/2020   Hx of sessile serrated colonic polyp 07/23/2019   Nausea with vomiting 05/18/2011   Anal fissure 05/18/2011    Past Surgical History:  Procedure Laterality Date   COLONOSCOPY  06/24/11   Diverticulosis, internal hemorrhoids,  no significant  polyps   MANDIBLE SURGERY  1979   Lower jaw alignment   PANENDOSCOPY  10/29/2005   hiatal hernia       Home Medications    Prior to Admission medications   Medication Sig Start Date End Date Taking? Authorizing Provider  psyllium (METAMUCIL) 58.6 % powder Take 1 packet by mouth daily.    [provider]    Family History Family History  Problem Relation Age of Onset   Diabetes Mother        Heart failure (sudden death age 38)   Diabetes Father        Died age 63.     Colon cancer Neg Hx    Stomach cancer Neg Hx    Pancreatic cancer Neg Hx    Esophageal cancer Neg Hx     Social History Social History   Tobacco Use   Smoking status: Former Smoker    Packs/day: 0.25     Years: 25.00    Pack years: 6.25    Types: Cigarettes   Smokeless tobacco: Never Used   Tobacco comment: Quit 5 years ago  Substance Use Topics   Alcohol use: Yes    Alcohol/week: 3.0 standard drinks    Types: 3 Standard drinks or equivalent per week   Drug use: No     Allergies   Sulfa antibiotics   Review of Systems Review of Systems  Reason unable to perform ROS: See HPI as above.     Physical Exam Triage Vital Signs ED Triage Vitals [02/14/20 1908]  Enc Vitals Group     BP 119/73     Pulse Rate 60     Resp 18     Temp 98.4 F (36.9 C)     Temp Source Oral     SpO2 93 %     Weight      Height      Head Circumference      Peak Flow      Pain Score 6     Pain Loc      Pain Edu?      Excl. in [place]?    No data found.  Updated  Vital Signs BP 119/73 (BP Location: Right Arm)    Pulse 60    Temp 98.4 F (36.9 C) (Oral)    Resp 18    SpO2 93%   Visual Acuity Right Eye Distance:   Left Eye Distance:   Bilateral Distance:    Right Eye Near: R Near: 20/50 Left Eye Near:  L Near: 20/50 Bilateral Near:  20/40  Physical Exam Constitutional:      General: He is not in acute distress.    Appearance: He is well-developed.  HENT:     Head: Normocephalic and atraumatic.  Eyes:     General: Lids are normal. Lids are everted, no foreign bodies appreciated.     Extraocular Movements: Extraocular movements intact.     Conjunctiva/sclera:     Right eye: Right conjunctiva is injected.     Left eye: Left conjunctiva is not injected.     Pupils: Pupils are equal, round, and reactive to light.     Comments: Right lateral chemosis.  No foreign body seen.  No photophobia on exam.  Fluorescein stain without uptake.  Musculoskeletal:     Cervical back: Normal range of motion and neck supple.  Skin:    General: Skin is warm and dry.  Neurological:     Mental Status: He is alert and oriented to person, place, and time.      UC Treatments / Results  Labs (all  labs ordered are listed, but only abnormal results are displayed) Labs Reviewed - No data to display  EKG   Radiology No results found.  Procedures Procedures (including critical care time)  Medications Ordered in UC Medications - No data to display  Initial Impression / Assessment and Plan / UC Course  I have reviewed the triage vital signs and the nursing notes.  Pertinent labs & imaging results that were available during my care of the patient were reviewed by me and considered in my medical decision making (see chart for details).    Patient with right eye conjunctival injection and lateral chemosis.  No photophobia on exam.  No foreign body seen.  Although with pain looking right, patient with full EOM.  Pain with movement slightly improved after tetracaine.  Fluorescein stain without uptake.  At this time, will have patient use artificial tears gel for symptomatic management.  Follow-up with ophthalmology tomorrow if symptoms not improving.  Return precautions given.  Patient expresses understanding and agrees to plan.  Final Clinical Impressions(s) / UC Diagnoses   Final diagnoses:  Acute right eye pain    ED Prescriptions    None     PDMP not reviewed this encounter.   Ok Edwards, PA-C 02/15/20 0800

## 2020-02-14 NOTE — Patient Instructions (Signed)
Medication Instructions:  Your physician recommends that you continue on your current medications as directed. Please refer to the Current Medication list given to you today.  *If you need a refill on your cardiac medications before your next appointment, please call your pharmacy*  Testing/Procedures: CT coronary calcium score. This test is done at [address]. This is $150 out of pocket.   Coronary CalciumScan A coronary calcium scan is an imaging test used to look for deposits of calcium and other fatty materials (plaques) in the inner lining of the blood vessels of the heart (coronary arteries). These deposits of calcium and plaques can partly clog and narrow the coronary arteries without producing any symptoms or warning signs. This puts a person at risk for a heart attack. This test can detect these deposits before symptoms develop. Tell a health care provider about:  Any allergies you have.  All medicines you are taking, including vitamins, herbs, eye drops, creams, and over-the-counter medicines.  Any problems you or family members have had with anesthetic medicines.  Any blood disorders you have.  Any surgeries you have had.  Any medical conditions you have.  Whether you are pregnant or may be pregnant. What are the risks? Generally, this is a safe procedure. However, problems may occur, including:  Harm to a pregnant woman and her unborn baby. This test involves the use of radiation. Radiation exposure can be dangerous to a pregnant woman and her unborn baby. If you are pregnant, you generally should not have this procedure done.  Slight increase in the risk of cancer. This is because of the radiation involved in the test. What happens before the procedure? No preparation is needed for this procedure. What happens during the procedure?  You will undress and remove any jewelry around your neck or chest.  You will put on a hospital gown.  Sticky  electrodes will be placed on your chest. The electrodes will be connected to an electrocardiogram (ECG) machine to record a tracing of the electrical activity of your heart.  A CT scanner will take pictures of your heart. During this time, you will be asked to lie still and hold your breath for 2-3 seconds while a picture of your heart is being taken. The procedure may vary among health care providers and hospitals. What happens after the procedure?  You can get dressed.  You can return to your normal activities.  It is up to you to get the results of your test. Ask your health care provider, or the department that is doing the test, when your results will be ready. Summary  A coronary calcium scan is an imaging test used to look for deposits of calcium and other fatty materials (plaques) in the inner lining of the blood vessels of the heart (coronary arteries).  Generally, this is a safe procedure. Tell your health care provider if you are pregnant or may be pregnant.  No preparation is needed for this procedure.  A CT scanner will take pictures of your heart.  You can return to your normal activities after the scan is done. This information is not intended to replace advice given to you by your health care provider. Make sure you discuss any questions you have with your health care provider. Document Released: 01/30/2008 Document Revised: 06/22/2016 Document Reviewed: 06/22/2016 Elsevier Interactive Patient Education  2017 Elsevier Inc.   Follow-Up: At [facility], you and your health needs are our priority.  As part of our   continuing mission to provide you with exceptional heart care, we have created designated Provider Care Teams.  These Care Teams include your primary Cardiologist (physician) and Advanced Practice Providers (APPs -  Physician Assistants and Nurse Practitioners) who all work together to provide you with the care you need, when you need it.  We recommend signing  up for the patient portal called "MyChart".  Sign up information is provided on this After Visit Summary.  MyChart is used to connect with patients for Virtual Visits (Telemedicine).  Patients are able to view lab/test results, encounter notes, upcoming appointments, etc.  Non-urgent messages can be sent to your provider as well.   To learn more about what you can do with MyChart, go to [email or web address].    Your next appointment:    AS NEEDED with Dr. Percival Spanish

## 2020-02-14 NOTE — ED Notes (Signed)
Patient able to ambulate independently  

## 2020-02-15 ENCOUNTER — Ambulatory Visit (INDEPENDENT_AMBULATORY_CARE_PROVIDER_SITE_OTHER)
Admission: RE | Admit: 2020-02-15 | Discharge: 2020-02-15 | Disposition: A | Discharge status: Home / Self Care | Payer: Self-pay | Source: Ambulatory Visit | Attending: Cardiology | Admitting: Cardiology

## 2020-02-15 DIAGNOSIS — R002 Palpitations: Secondary | ICD-10-CM

## 2020-02-15 DIAGNOSIS — R42 Dizziness and giddiness: Secondary | ICD-10-CM

## 2020-02-20 ENCOUNTER — Other Ambulatory Visit: Payer: Self-pay

## 2020-02-20 DIAGNOSIS — R002 Palpitations: Secondary | ICD-10-CM

## 2020-02-20 DIAGNOSIS — R42 Dizziness and giddiness: Secondary | ICD-10-CM

## 2020-02-21 ENCOUNTER — Telehealth: Payer: Self-pay | Admitting: Cardiology

## 2020-02-21 ENCOUNTER — Encounter: Payer: Self-pay | Admitting: Cardiology

## 2020-02-21 NOTE — Telephone Encounter (Signed)
Spoke with patient to schedule the ETT ordered by Dr. Percival Spanish.  Patient is scheduled for Thursday 02/29/20 at 3:30 pm here at [facility].  Will mail information to patient and he voiced his understanding.

## 2020-02-21 NOTE — Telephone Encounter (Signed)
-----   Message from Caprice Beaver, LPN sent at 04/26/2779  2:49 PM EDT ----- POET ordered for patient-  Schedulers please schedule. Thank you!

## 2020-02-27 ENCOUNTER — Telehealth (HOSPITAL_COMMUNITY): Payer: Self-pay

## 2020-02-27 NOTE — Telephone Encounter (Signed)
Encounter complete. 

## 2020-02-29 ENCOUNTER — Ambulatory Visit (HOSPITAL_COMMUNITY)
Admission: RE | Admit: 2020-02-29 | Discharge: 2020-02-29 | Disposition: A | Discharge status: Home / Self Care | Payer: Commercial Managed Care - PPO | Source: Ambulatory Visit | Attending: Cardiovascular Disease | Admitting: Cardiovascular Disease

## 2020-02-29 ENCOUNTER — Other Ambulatory Visit: Payer: Self-pay

## 2020-02-29 DIAGNOSIS — R42 Dizziness and giddiness: Secondary | ICD-10-CM | POA: Insufficient documentation

## 2020-02-29 DIAGNOSIS — R002 Palpitations: Secondary | ICD-10-CM

## 2020-02-29 LAB — EXERCISE TOLERANCE TEST
Estimated workload: 10.5 METS
Exercise duration (min): 9 min
Exercise duration (sec): 18 s
MPHR: 164 {beats}/min
Peak HR: 164 {beats}/min
Percent HR: 100 %
Rest HR: 58 {beats}/min

## 2021-08-06 IMAGING — CT CT CARDIAC CORONARY ARTERY CALCIUM SCORE
3 series · 14 of 20 positions shown, 15 images · non-contrast
Comparison: None.
COMPARISON: None.

Addendum:
EXAM:
OVER-READ INTERPRETATION  CT CHEST

The following report is an over-read performed by radiologist Dr.
Felipe Nicolas Hun [REDACTED] on 02/15/2020. This
over-read does not include interpretation of cardiac or coronary
anatomy or pathology. The coronary calcium score/coronary CTA
interpretation by the cardiologist is attached.
CLINICAL DATA: Risk stratification
Coronary Calcium Score
TECHNIQUE: The patient was scanned on a Siemens Somatom 64 slice scanner. Axial
non-contrast 3 mm slices were carried out through the heart. The
data set was analyzed on a dedicated work station and scored using
the Agatson method.

[Series 2: casc 3.0 bv41 2 bestdiast 69 % · axial · 0.40mm/px · z∈[-253,-175]mm · 4 of 44 slices shown, 5 images]
[im 9/44  vessel]
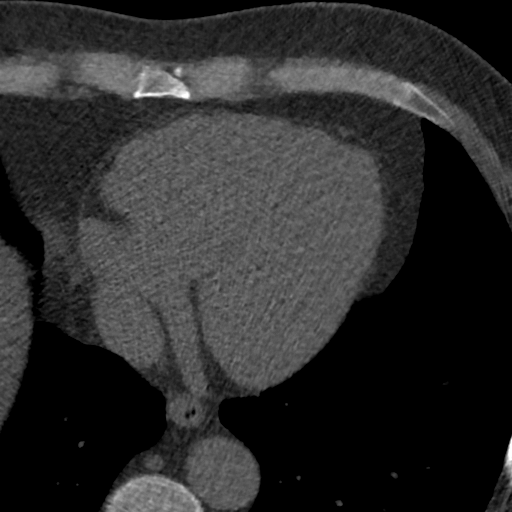
[im 9/44  lung]
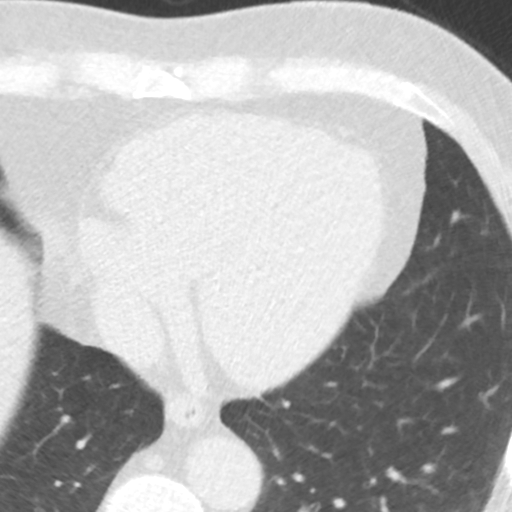
[im 18/44  vessel]
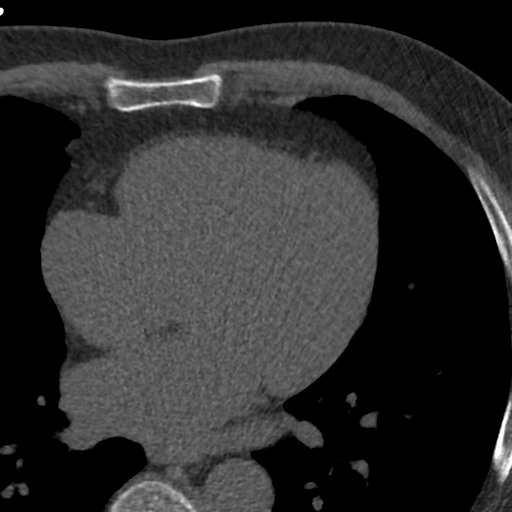
[im 26/44  vessel]
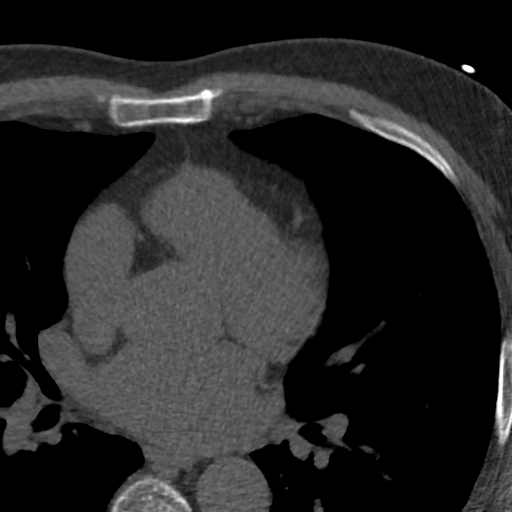
[im 35/44  vessel]
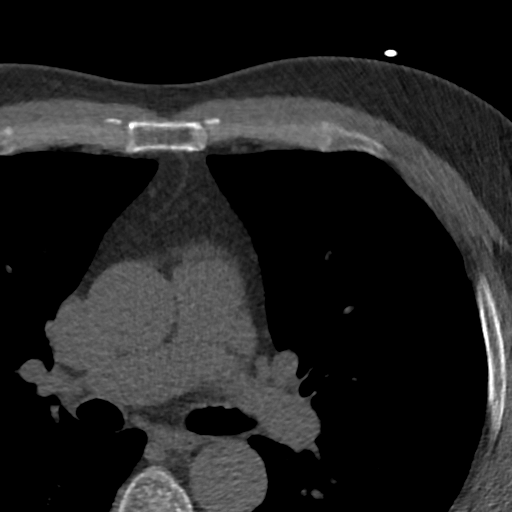

[Series 3: lung 69 % · axial · 0.72mm/px · z∈[-256,-172]mm · 5 of 44 slices shown]
[im 8/44  lung]
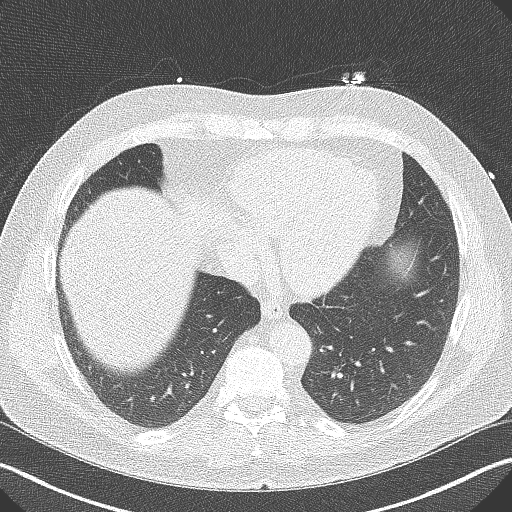
[im 15/44  lung]
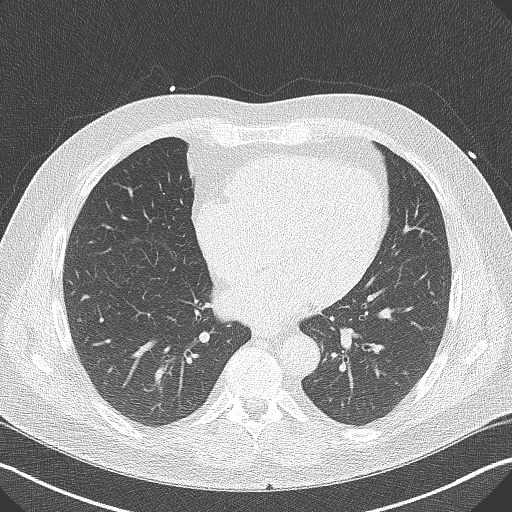
[im 22/44  lung]
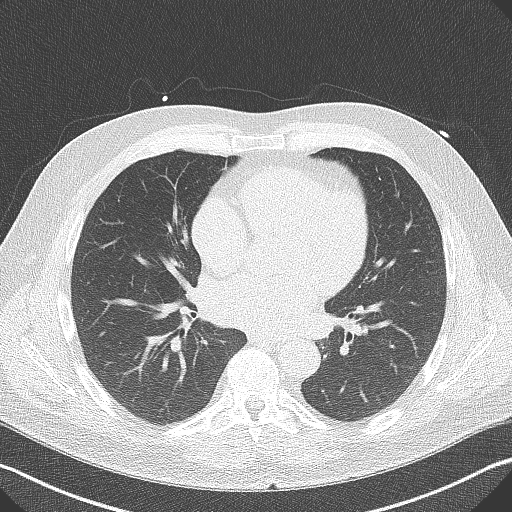
[im 29/44  lung]
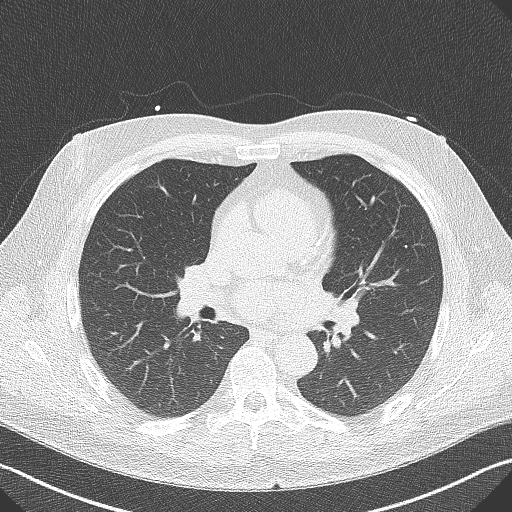
[im 36/44  lung]
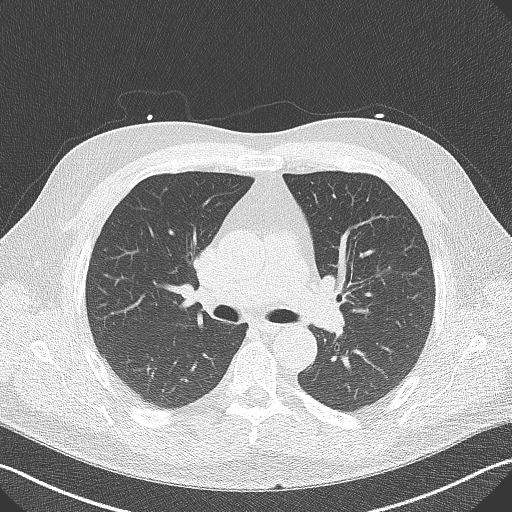

[Series 4: lung st 69 % · axial · 0.72mm/px · z∈[-256,-172]mm · 5 of 44 slices shown]
[im 8/44  lung]
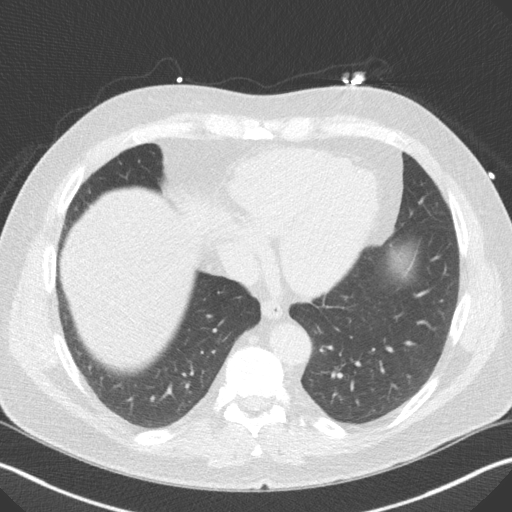
[im 15/44  lung]
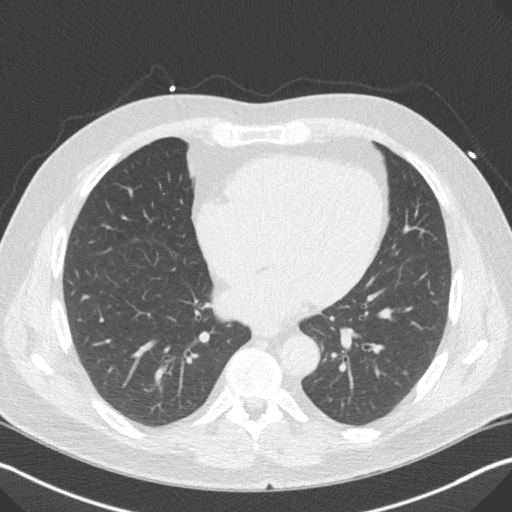
[im 22/44  lung]
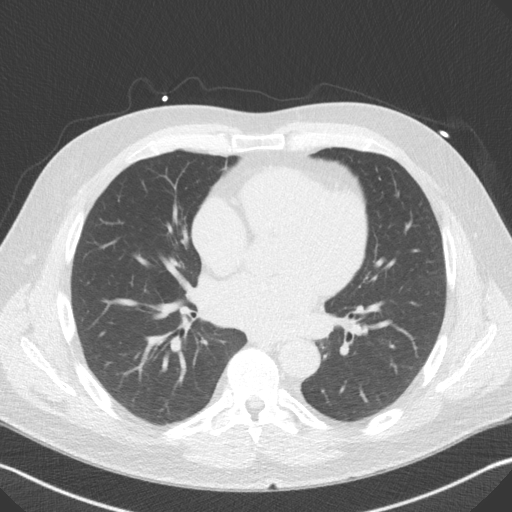
[im 29/44  lung]
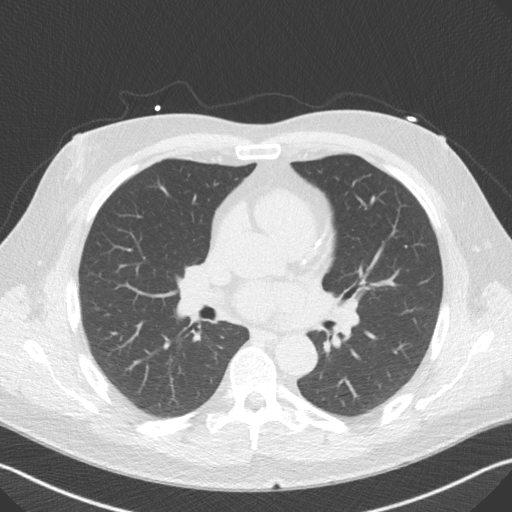
[im 36/44  lung]
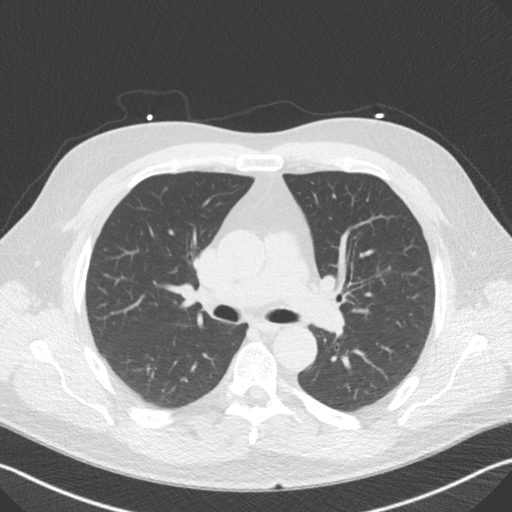

[14 of 20 positions shown; findings below may reference images not displayed]

FINDINGS: Aortic atherosclerosis. Within the visualized portions of the thorax
there are no suspicious appearing pulmonary nodules or masses, there
is no acute consolidative airspace disease, no pleural effusions, no
pneumothorax and no lymphadenopathy. Visualized portions of the
upper abdomen demonstrates diffuse low attenuation throughout the
visualized hepatic parenchyma. There are no aggressive appearing
lytic or blastic lesions noted in the visualized portions of the
skeleton.
IMPRESSION: 1.  Aortic Atherosclerosis (2TS55-IS6.6).
2. Hepatic steatosis.
FINDINGS: Non-cardiac: See separate report from [REDACTED].

Ascending aorta: Normal size

Pericardium: Normal

Coronary arteries: Normal origin
IMPRESSION: Coronary calcium score of 93.2. This was 76 percentile for age and
sex matched control.

Swetha Popo

*** End of Addendum ***
EXAM:
OVER-READ INTERPRETATION  CT CHEST

The following report is an over-read performed by radiologist Dr.
Felipe Nicolas Hun [REDACTED] on 02/15/2020. This
over-read does not include interpretation of cardiac or coronary
anatomy or pathology. The coronary calcium score/coronary CTA
interpretation by the cardiologist is attached.
FINDINGS: Aortic atherosclerosis. Within the visualized portions of the thorax
there are no suspicious appearing pulmonary nodules or masses, there
is no acute consolidative airspace disease, no pleural effusions, no
pneumothorax and no lymphadenopathy. Visualized portions of the
upper abdomen demonstrates diffuse low attenuation throughout the
visualized hepatic parenchyma. There are no aggressive appearing
lytic or blastic lesions noted in the visualized portions of the
skeleton.
IMPRESSION: 1.  Aortic Atherosclerosis (2TS55-IS6.6).
2. Hepatic steatosis.

## 2021-09-18 ENCOUNTER — Encounter: Payer: Self-pay | Admitting: Internal Medicine
